# Patient Record
Sex: Female | Born: 1985 | Race: White | Hispanic: No | Marital: Married | Smoking: Current every day smoker
Health system: Southern US, Community
[De-identification: ages and names within clinical notes are randomized; demographics above are authoritative.]

## PROBLEM LIST (undated history)

## (undated) DIAGNOSIS — D179 Benign lipomatous neoplasm, unspecified: Secondary | ICD-10-CM

## (undated) DIAGNOSIS — F419 Anxiety disorder, unspecified: Secondary | ICD-10-CM

## (undated) DIAGNOSIS — R569 Unspecified convulsions: Secondary | ICD-10-CM

## (undated) HISTORY — PX: TONSILLECTOMY: SUR1361

## (undated) HISTORY — DX: Benign lipomatous neoplasm, unspecified: D17.9

---
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
## 2006-02-10 ENCOUNTER — Emergency Department: Payer: Self-pay | Admitting: Unknown Physician Specialty

## 2007-11-08 ENCOUNTER — Ambulatory Visit: Payer: Self-pay | Admitting: Family Medicine

## 2009-11-30 ENCOUNTER — Emergency Department: Payer: Self-pay | Admitting: Internal Medicine

## 2009-12-08 ENCOUNTER — Emergency Department: Payer: Self-pay | Admitting: Emergency Medicine

## 2010-08-28 ENCOUNTER — Emergency Department: Payer: Self-pay | Admitting: Unknown Physician Specialty

## 2011-01-02 ENCOUNTER — Observation Stay: Payer: Self-pay | Admitting: Obstetrics and Gynecology

## 2011-03-02 ENCOUNTER — Inpatient Hospital Stay: Payer: Self-pay

## 2012-05-26 IMAGING — US US PELV - US TRANSVAGINAL
1 series · 17 of 25 positions shown · non-contrast
Comparison: none

REASON FOR EXAM: pain lower pelvic area bilat
COMMENTS:

[Series 1: us pelv - us transvaginal · 17 of 48 slices shown]
[im 1/48]
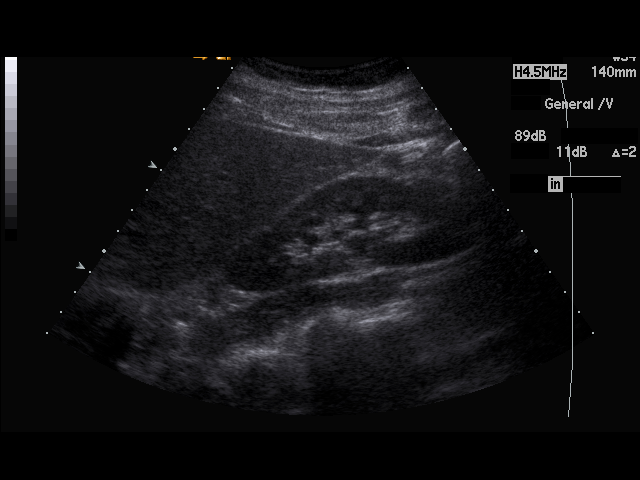
[im 4/48]
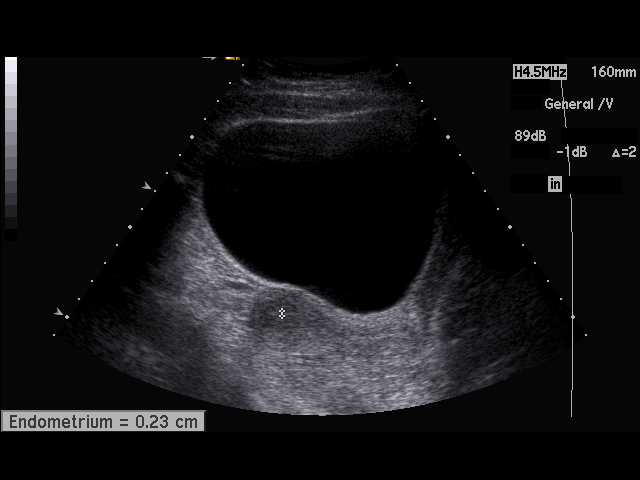
[im 6/48]
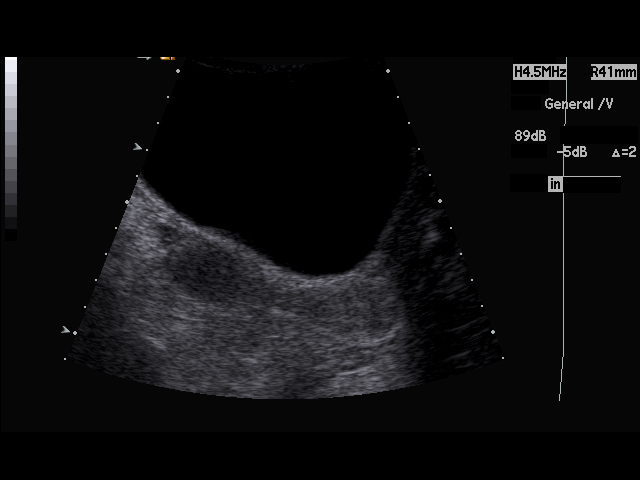
[im 10/48]
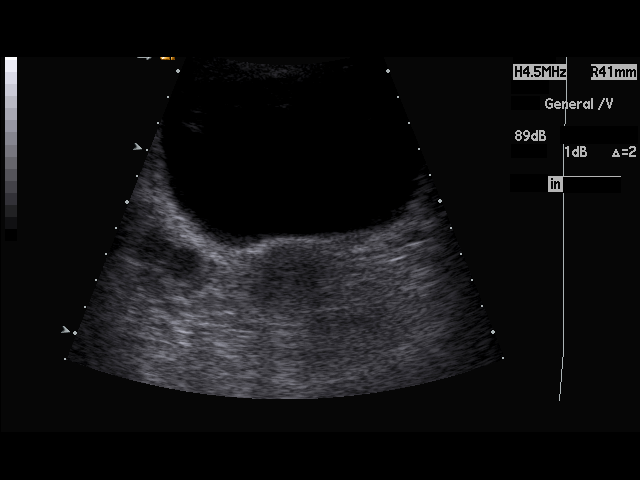
[im 12/48]
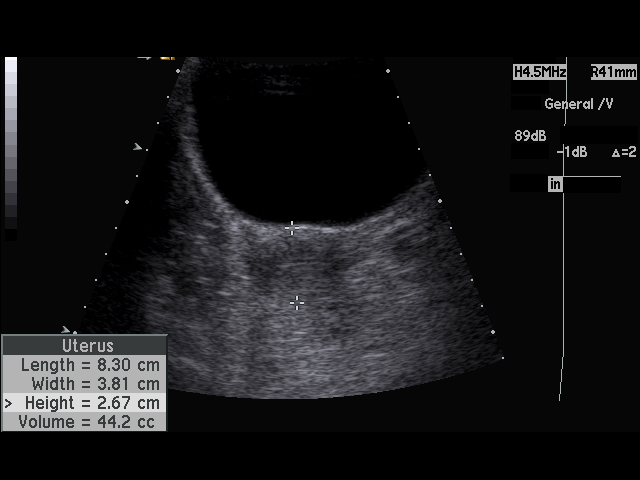
[im 16/48]
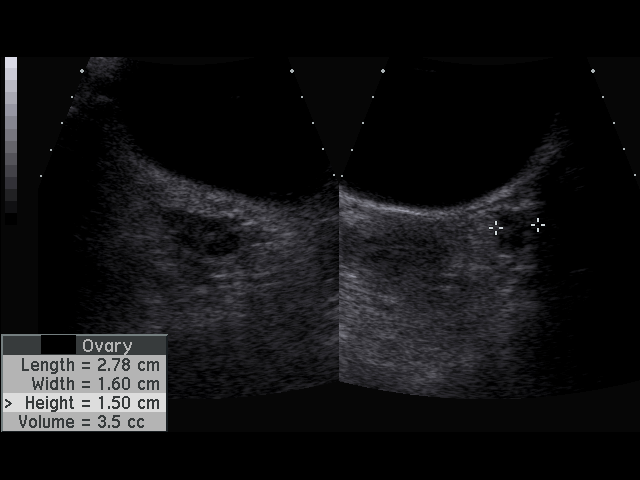
[im 18/48]
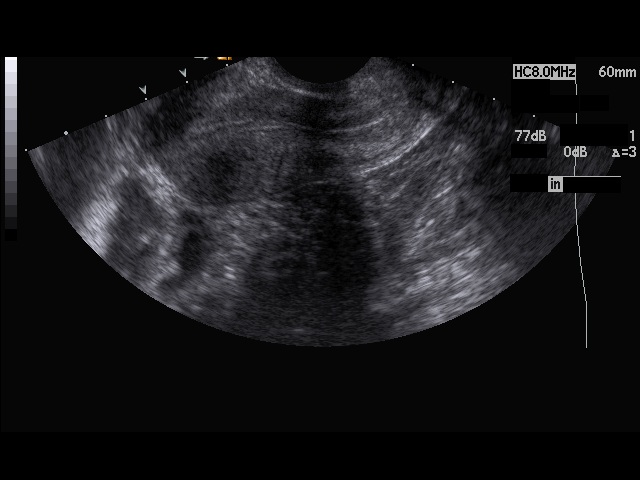
[im 22/48]
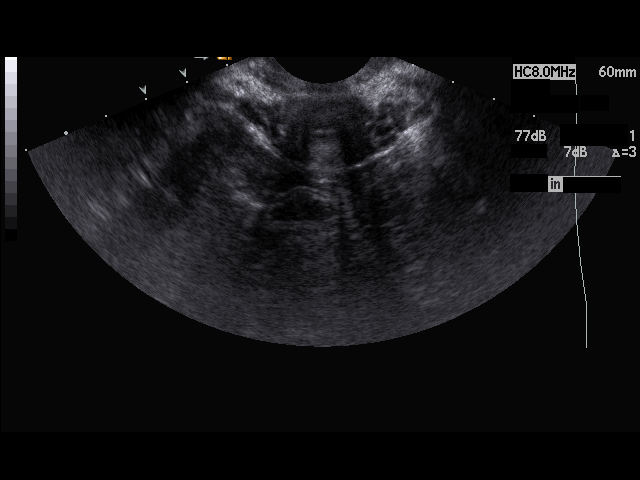
[im 24/48]
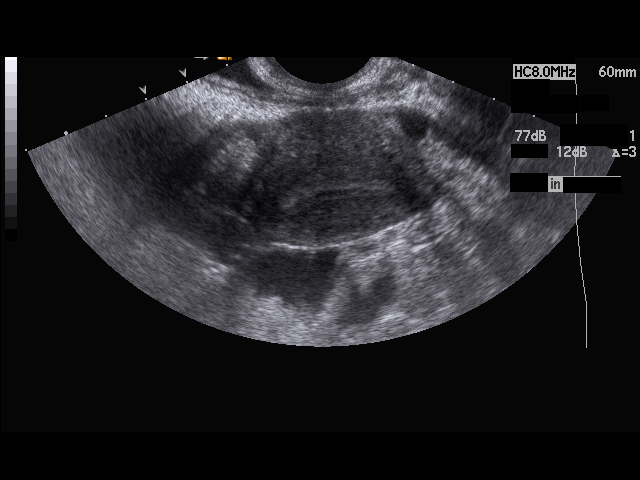
[im 26/48]
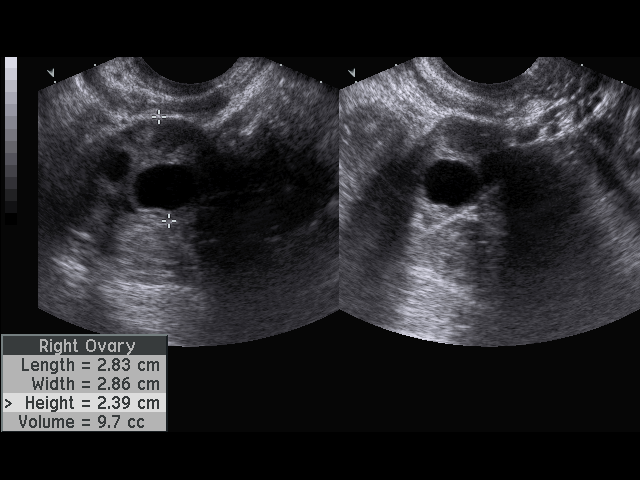
[im 30/48]
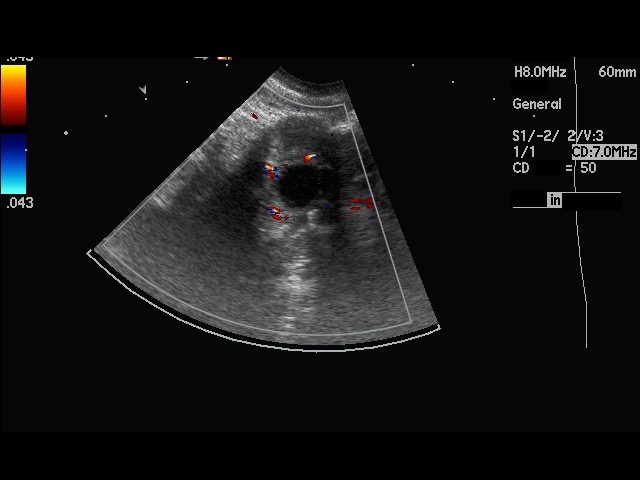
[im 32/48]
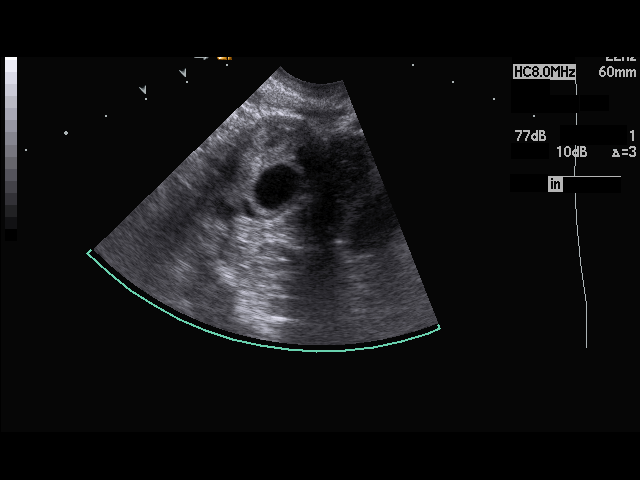
[im 36/48]
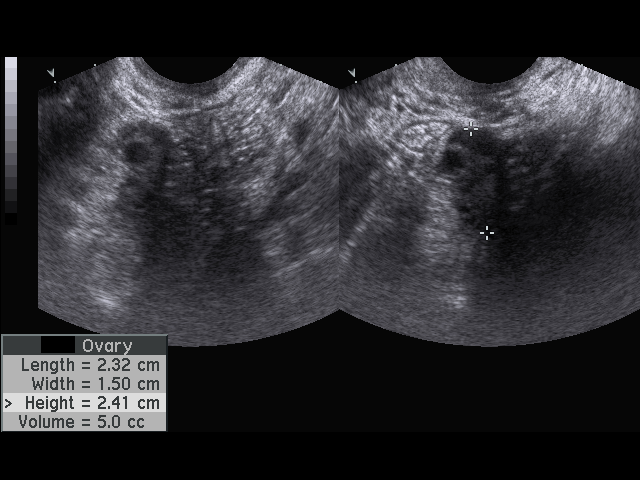
[im 38/48]
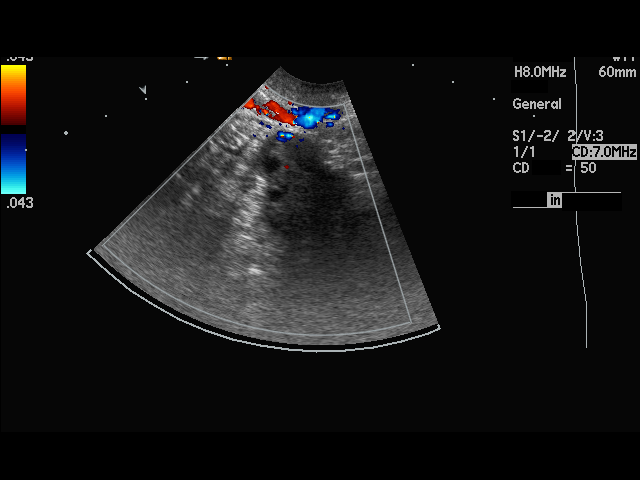
[im 42/48]
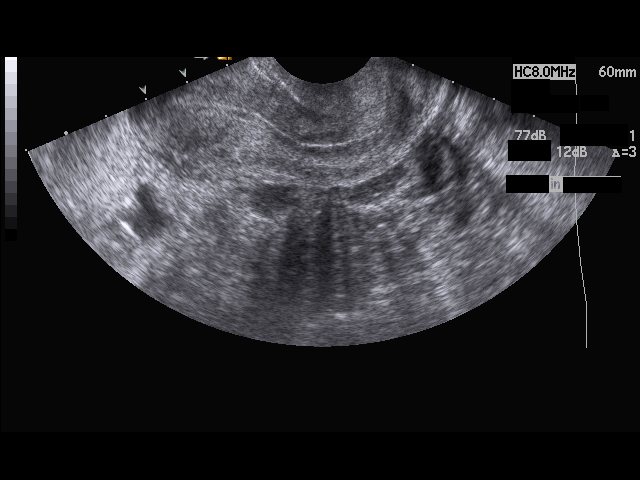
[im 44/48]
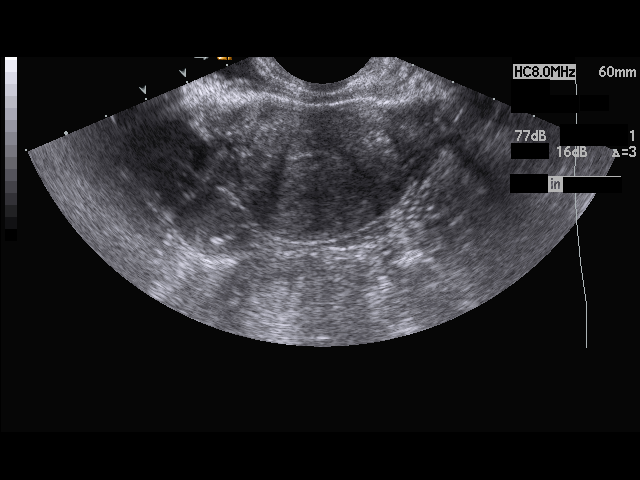
[im 48/48]
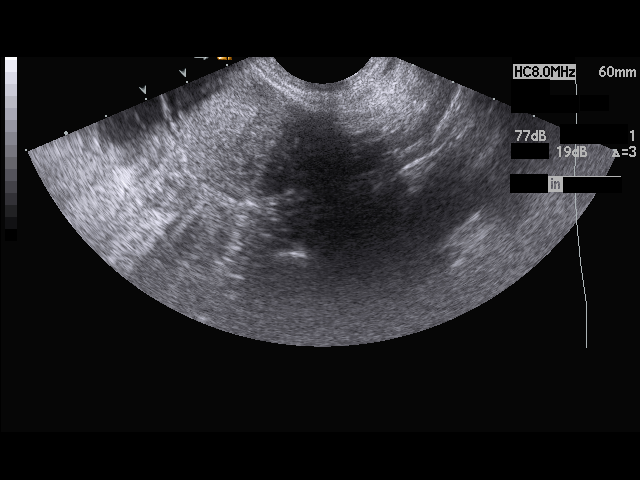

[17 of 25 positions shown; findings below may reference images not displayed]

PROCEDURE:     US  - US PELVIS EXAM W/TRANSVAGINAL  - December 08, 2009  [DATE]

RESULT:     Comparison: None.

Technique and Findings:
Grayscale and color Doppler images were obtained of the pelvis. Endovaginal
ultrasound was also performed.

The uterus measures 8.3 x 3.8 x 2.7 cm. The endometrial stripe measures
mm. No free fluid identified in the pelvis.

The right ovary measures 2.8 x 2.9 x 2.4 cm. The left ovary measures 2.3 x
1.5 x 2.4 cm. There are several anechoic cystic structures in the bilateral
ovaries which likely represents cysts or follicles. These are less than 2 cm
in diameter. Color Doppler flow is associated with the bilateral ovaries.
IMPRESSION: No acute findings. If there is continued clinical concern for ovarian
torsion, further investigation with spectral Doppler ultrasound imaging of
the ovaries is recommended.

## 2013-09-10 ENCOUNTER — Ambulatory Visit: Payer: Self-pay | Admitting: Obstetrics & Gynecology

## 2013-09-10 LAB — CBC WITH DIFFERENTIAL/PLATELET
Basophil #: 0 10*3/uL (ref 0.0–0.1)
Basophil %: 0.1 %
EOS ABS: 0.1 10*3/uL (ref 0.0–0.7)
Eosinophil %: 1 %
HCT: 38.3 % (ref 35.0–47.0)
HGB: 13.1 g/dL (ref 12.0–16.0)
LYMPHS ABS: 1.9 10*3/uL (ref 1.0–3.6)
LYMPHS PCT: 18.7 %
MCH: 32.5 pg (ref 26.0–34.0)
MCHC: 34.2 g/dL (ref 32.0–36.0)
MCV: 95 fL (ref 80–100)
MONO ABS: 0.8 x10 3/mm (ref 0.2–0.9)
Monocyte %: 7.8 %
Neutrophil #: 7.4 10*3/uL — ABNORMAL HIGH (ref 1.4–6.5)
Neutrophil %: 72.4 %
Platelet: 166 10*3/uL (ref 150–440)
RBC: 4.03 10*6/uL (ref 3.80–5.20)
RDW: 13 % (ref 11.5–14.5)
WBC: 10.2 10*3/uL (ref 3.6–11.0)

## 2013-09-11 ENCOUNTER — Inpatient Hospital Stay: Payer: Self-pay | Admitting: Obstetrics and Gynecology

## 2013-09-12 LAB — HEMATOCRIT: HCT: 33 % — ABNORMAL LOW (ref 35.0–47.0)

## 2014-08-21 NOTE — Op Note (Signed)
PATIENT NAME:  Paula Diaz, Paula Diaz MR#:  [id number] DATE OF BIRTH:  [birth year]  DATE OF PROCEDURE:  09/11/2013  PREOPERATIVE DIAGNOSIS:  Term intrauterine pregnancy, prior history of cesarean section.   POSTOPERATIVE DIAGNOSIS:  Term intrauterine pregnancy, prior history of cesarean section.   PROCEDURE PERFORMED:  Low transverse cesarean section, placement of On-Q pain pump.   SURGEON:  Glean Salen, M.D.   ASSISTANT:  Baruch Merl.   ANESTHESIA:  Spinal.   ESTIMATED BLOOD LOSS:  250 mL.   COMPLICATIONS:  None.   FINDINGS:  Normal tubes, ovaries and uterus. Viable female infant weighing 8 pounds 11 ounces with Apgar scores of 9 and 9 at 1 and 5 minutes, respectively.   DISPOSITION:  To the Recovery Room in stable condition.   TECHNIQUE:  The patient is prepped and draped in the usual sterile fashion after adequate anesthesia is obtained in the supine position on the Operating Room table. Scalpel was used to create a low transverse skin incision including excision of part of the thickened keloid scar. The incision is carried down to the level of the rectus fascia which is then dissected bilaterally using Mayo scissors. The rectus muscles are dissected away from the rectus fascia and separated in the midline. The peritoneum is penetrated and the bladder is inferiorly dissected and retracted. A scalpel was used to create a low transverse hysterotomy incision that is then extended by blunt dissection. Amniotomy reveals clear fluid. The infant's head is grasped and delivered with the help of a suction device. The oropharynx is suctioned, no nuchal cord is noted and the rest of the infant is delivered without complication.   Cord blood is obtained and the placenta is manually extracted. The uterus is externalized and cleansed of all membranes and debris using a moist sponge. The hysterotomy incision is closed with a running #1 Vicryl suture in a locking fashion followed by a second layer to imbricate  the first layer. Excellent hemostasis is noted. The uterus is then placed back in the intra-abdominal cavity and the paracolic gutters are irrigated with warm saline. Re-examination of the incision reveals excellent hemostasis. The peritoneum is closed with a Vicryl suture.   On-Q pain pump trocars are placed through the abdomen into the subfascial space and then the Silver soaker catheters are threaded into place without difficulty. A Maxon suture used to close the rectus fascia with careful placement of the suture not to incorporate the catheters. The catheters are flushed with 5 mL each of bupivacaine and stabilized in place using bandages.   The subcutaneous layer is irrigated with saline and hemostasis assured using electrocautery. The skin is closed with a 4-0 Vicryl suture in a subcuticular fashion followed by placement of Dermabond. The patient goes to the Recovery Room in stable condition. All sponge, instrument, and needle counts are correct.   ____________________________ R. Barnett Applebaum, MD rph:jm D: 09/11/2013 11:36:40 ET T: 09/11/2013 11:57:08 ET JOB#: [id number]  cc: Glean Salen, MD, <Dictator> Gae Dry MD ELECTRONICALLY SIGNED 09/11/2013 13:11

## 2014-10-01 ENCOUNTER — Encounter: Payer: Self-pay | Admitting: *Deleted

## 2014-10-01 ENCOUNTER — Other Ambulatory Visit: Payer: Self-pay

## 2014-10-01 DIAGNOSIS — Z882 Allergy status to sulfonamides status: Secondary | ICD-10-CM | POA: Diagnosis not present

## 2014-10-01 DIAGNOSIS — F17210 Nicotine dependence, cigarettes, uncomplicated: Secondary | ICD-10-CM | POA: Diagnosis not present

## 2014-10-01 DIAGNOSIS — D171 Benign lipomatous neoplasm of skin and subcutaneous tissue of trunk: Secondary | ICD-10-CM | POA: Diagnosis present

## 2014-10-01 NOTE — Patient Instructions (Signed)
  Your procedure is scheduled on: 10-06-14 Report to [place]. To find out your arrival time please call [phone] between 1PM - 3PM on 10-05-14  Remember: Instructions that are not followed completely may result in serious medical risk, up to and including death, or upon the discretion of your surgeon and anesthesiologist your surgery may need to be rescheduled.    _X___ 1. Do not eat food or drink liquids after midnight. No gum chewing or hard candies.     _X___ 2. No Alcohol for 24 hours before or after surgery.   ____ 3. Bring all medications with you on the day of surgery if instructed.    ____ 4. Notify your doctor if there is any change in your medical condition     (cold, fever, infections).     Do not wear jewelry, make-up, hairpins, clips or nail polish.  Do not wear lotions, powders, or perfumes. You may wear deodorant.  Do not shave 48 hours prior to surgery. Men may shave face and neck.  Do not bring valuables to the hospital.    [facility] is not responsible for any belongings or valuables.               Contacts, dentures or bridgework may not be worn into surgery.  Leave your suitcase in the car. After surgery it may be brought to your room.  For patients admitted to the hospital, discharge time is determined by your  treatment team.   Patients discharged the day of surgery will not be allowed to drive home.   Please read over the following fact sheets that you were given:     ____ Take these medicines the morning of surgery with A SIP OF WATER:    1.NONE  2.   3.   4.  5.  6.  ____ Fleet Enema (as directed)   ____ Use CHG Soap as directed  ____ Use inhalers on the day of surgery  ____ Stop metformin 2 days prior to surgery    ____ Take 1/2 of usual insulin dose the night before surgery and none on the morning of surgery.   ____ Stop Coumadin/Plavix/aspirin-N/A  ____ Stop Anti-inflammatories-NO NSAIDS OR ASA  PRODUCTS-TYLENOL OK   ____ Stop supplements until after surgery.    ____ Bring C-Pap to the hospital.

## 2014-10-06 ENCOUNTER — Ambulatory Visit: Payer: BC Managed Care – PPO | Admitting: Anesthesiology

## 2014-10-06 ENCOUNTER — Ambulatory Visit
Admission: RE | Admit: 2014-10-06 | Discharge: 2014-10-06 | Disposition: A | Discharge status: Home / Self Care | Payer: BC Managed Care – PPO | Source: Ambulatory Visit | Attending: Surgery | Admitting: Surgery

## 2014-10-06 ENCOUNTER — Encounter
Admission: RE | Disposition: A | Discharge status: Home / Self Care | Payer: Self-pay | Source: Ambulatory Visit | Attending: Surgery

## 2014-10-06 ENCOUNTER — Encounter: Payer: Self-pay | Admitting: *Deleted

## 2014-10-06 DIAGNOSIS — D171 Benign lipomatous neoplasm of skin and subcutaneous tissue of trunk: Secondary | ICD-10-CM | POA: Insufficient documentation

## 2014-10-06 DIAGNOSIS — Z882 Allergy status to sulfonamides status: Secondary | ICD-10-CM | POA: Insufficient documentation

## 2014-10-06 DIAGNOSIS — F17210 Nicotine dependence, cigarettes, uncomplicated: Secondary | ICD-10-CM | POA: Insufficient documentation

## 2014-10-06 HISTORY — DX: Anxiety disorder, unspecified: F41.9

## 2014-10-06 HISTORY — DX: Unspecified convulsions: R56.9

## 2014-10-06 HISTORY — PX: LIPOMA EXCISION: SHX5283

## 2014-10-06 LAB — POCT PREGNANCY, URINE: Preg Test, Ur: NEGATIVE

## 2014-10-06 SURGERY — EXCISION LIPOMA
Anesthesia: General | Laterality: Right

## 2014-10-06 MED ORDER — DEXAMETHASONE SODIUM PHOSPHATE 4 MG/ML IJ SOLN
INTRAMUSCULAR | Status: DC | PRN
  Administered 2014-10-06: 8 mg via INTRAVENOUS

## 2014-10-06 MED ORDER — ONDANSETRON HCL 4 MG/2ML IJ SOLN
INTRAMUSCULAR | Status: DC | PRN
  Administered 2014-10-06: 4 mg via INTRAVENOUS

## 2014-10-06 MED ORDER — HYDROCODONE-ACETAMINOPHEN 5-325 MG PO TABS: 1.0000 | ORAL_TABLET | Freq: Four times a day (QID) | ORAL | Status: DC | PRN

## 2014-10-06 MED ORDER — ONDANSETRON HCL 4 MG/2ML IJ SOLN: 4.0000 mg | Freq: Once | INTRAMUSCULAR | Status: DC | PRN

## 2014-10-06 MED ORDER — HYDROCODONE-ACETAMINOPHEN 5-325 MG PO TABS
ORAL_TABLET | ORAL | Status: AC
  Administered 2014-10-06: 1 via ORAL
  Filled 2014-10-06: qty 1

## 2014-10-06 MED ORDER — NEOSTIGMINE METHYLSULFATE 10 MG/10ML IV SOLN
INTRAVENOUS | Status: DC | PRN
  Administered 2014-10-06: 3 mg via INTRAVENOUS

## 2014-10-06 MED ORDER — HYDROCODONE-ACETAMINOPHEN 5-325 MG PO TABS
1.0000 | ORAL_TABLET | Freq: Four times a day (QID) | ORAL | Status: DC | PRN
  Administered 2014-10-06: 1 via ORAL

## 2014-10-06 MED ORDER — LACTATED RINGERS IV SOLN
INTRAVENOUS | Status: DC
  Administered 2014-10-06 (×2): via INTRAVENOUS

## 2014-10-06 MED ORDER — FENTANYL CITRATE (PF) 100 MCG/2ML IJ SOLN
INTRAMUSCULAR | Status: DC | PRN
  Administered 2014-10-06 (×2): 100 ug via INTRAVENOUS

## 2014-10-06 MED ORDER — FAMOTIDINE 20 MG PO TABS
ORAL_TABLET | ORAL | Status: AC
  Administered 2014-10-06: 20 mg via ORAL
  Filled 2014-10-06: qty 1

## 2014-10-06 MED ORDER — GLYCOPYRROLATE 0.2 MG/ML IJ SOLN
INTRAMUSCULAR | Status: DC | PRN
  Administered 2014-10-06: 0.4 mg via INTRAVENOUS

## 2014-10-06 MED ORDER — FENTANYL CITRATE (PF) 100 MCG/2ML IJ SOLN
25.0000 ug | INTRAMUSCULAR | Status: DC | PRN
  Administered 2014-10-06 (×4): 25 ug via INTRAVENOUS

## 2014-10-06 MED ORDER — FENTANYL CITRATE (PF) 100 MCG/2ML IJ SOLN
INTRAMUSCULAR | Status: AC
  Filled 2014-10-06: qty 2

## 2014-10-06 MED ORDER — PROPOFOL 10 MG/ML IV BOLUS
INTRAVENOUS | Status: DC | PRN
  Administered 2014-10-06: 150 mg via INTRAVENOUS

## 2014-10-06 MED ORDER — MIDAZOLAM HCL 2 MG/2ML IJ SOLN
INTRAMUSCULAR | Status: DC | PRN
  Administered 2014-10-06: 2 mg via INTRAVENOUS

## 2014-10-06 MED ORDER — LIDOCAINE HCL (CARDIAC) 20 MG/ML IV SOLN
INTRAVENOUS | Status: DC | PRN
  Administered 2014-10-06: 60 mg via INTRAVENOUS

## 2014-10-06 MED ORDER — ROCURONIUM BROMIDE 100 MG/10ML IV SOLN
INTRAVENOUS | Status: DC | PRN
  Administered 2014-10-06: 40 mg via INTRAVENOUS

## 2014-10-06 MED ORDER — FAMOTIDINE 20 MG PO TABS
20.0000 mg | ORAL_TABLET | Freq: Once | ORAL | Status: AC
  Administered 2014-10-06: 20 mg via ORAL

## 2014-10-06 SURGICAL SUPPLY — 21 items
BLADE SURG 15 STRL LF DISP TIS (BLADE) ×1 IMPLANT
BLADE SURG 15 STRL SS (BLADE) ×1
CHLORAPREP W/TINT 26ML (MISCELLANEOUS) ×2 IMPLANT
DRAPE LAPAROTOMY 100X77 ABD (DRAPES) ×2 IMPLANT
ELECT CAUTERY BLADE 6.4 (BLADE) ×2 IMPLANT
GAUZE SPONGE 4X4 12PLY STRL (GAUZE/BANDAGES/DRESSINGS) ×2 IMPLANT
GLOVE BIO SURGEON STRL SZ7.5 (GLOVE) ×6 IMPLANT
GOWN STRL REUS W/ TWL LRG LVL3 (GOWN DISPOSABLE) ×3 IMPLANT
GOWN STRL REUS W/TWL LRG LVL3 (GOWN DISPOSABLE) ×3
KIT RM TURNOVER STRD PROC AR (KITS) ×2 IMPLANT
LABEL OR SOLS (LABEL) IMPLANT
NDL SAFETY 25GX1.5 (NEEDLE) ×2 IMPLANT
NS IRRIG 500ML POUR BTL (IV SOLUTION) ×2 IMPLANT
PACK BASIN MINOR ARMC (MISCELLANEOUS) ×2 IMPLANT
PAD GROUND ADULT SPLIT (MISCELLANEOUS) ×2 IMPLANT
SUT STRIP PLUS 1X5 (SUTURE) ×2 IMPLANT
SUT VIC AB 3-0 SH 27 (SUTURE) ×1
SUT VIC AB 3-0 SH 27X BRD (SUTURE) ×1 IMPLANT
SUTURE MNCRYL 4-0 (SUTURE) ×2 IMPLANT
SYR BULB EAR ULCER 3OZ GRN STR (SYRINGE) ×2 IMPLANT
SYRINGE 10CC LL (SYRINGE) ×2 IMPLANT

## 2014-10-06 NOTE — Anesthesia Procedure Notes (Signed)
Procedure Name: Intubation Date/Time: 10/06/2014 7:30 AM Performed by: Letitia Neri Pre-anesthesia Checklist: Patient identified, Emergency Drugs available, Suction available, Patient being monitored and Timeout performed Patient Re-evaluated:Patient Re-evaluated prior to inductionOxygen Delivery Method: Circle system utilized Preoxygenation: Pre-oxygenation with 100% oxygen Intubation Type: IV induction Ventilation: Mask ventilation without difficulty Laryngoscope Size: Mac and 3 Grade View: Grade I Tube type: Oral Tube size: 7.0 mm Placement Confirmation: ETT inserted through vocal cords under direct vision,  positive ETCO2 and breath sounds checked- equal and bilateral Secured at: 21 cm Tube secured with: Tape Dental Injury: Teeth and Oropharynx as per pre-operative assessment

## 2014-10-06 NOTE — Transfer of Care (Signed)
Immediate Anesthesia Transfer of Care Note  Patient: Paula Diaz  Procedure(s) Performed: Procedure(s): EXCISION LIPOMA (Right)  Patient Location: PACU  Anesthesia Type:General  Level of Consciousness: sedated  Airway & Oxygen Therapy: Patient Spontanous Breathing  Post-op Assessment: Report given to RN and Post -op Vital signs reviewed and stable  Post vital signs: Reviewed and stable  Last Vitals:  Filed Vitals:   10/06/14 0619  BP: 116/63  Pulse: 83  Temp: 36.6 C  Resp: 16    Complications: No apparent anesthesia complications

## 2014-10-06 NOTE — Progress Notes (Signed)
H&P unchanged. Paper copy to be scanned into the system. The risks of surgery were again reviewed with the patient and family and they agree to proceed.

## 2014-10-06 NOTE — Op Note (Signed)
Operative Note  Preoperative Diagnosis: Right back subcutaneous lipoma  Postoperative Diagnosis: Same  Operation Performed: Excision of right back subcutaneous lipoma  Surgeon: Laverle Patter., M.D.   Assistant: None  Anesthesia: General Endotracheal  Date of Procedure: 10/06/2014   Procedure in Detail:  The risks (including the possibility of adjacent organ injury, the necessity of converting to an open procedure, and the risk of postoperative infection / abscess), potential benefits, non-surgical treatment options, and expected outcomes were reviewed with the patient. The patient concurred with the proposed plan and agreed to proceed, giving informed consent.   Prior to the induction of anesthesia, antibiotic prophylaxis was administered. The patient was placed supine on the OR table and prepped and draped in the usual sterile fashion.   A transverse incision was made overlying the lipoma in the lines of the skin and carried down through the skin and subcutaneous tissue to the lipomatous capsule which was excised from surrounding subcutaneous tissue and the muscular fascia. The entire capsule and lipoma were successfully removed. Hemostasis was achieved with electrocautery a subdermal closure was performed with interrupted 3-0 Vicryl and the skin was reapproximated with a running subcuticular 4-0 Monocryl and suture strip.  Findings: 30 x 75 x 70 mm encapsulated lipoma          Specimens: Lipoma           Complications: None; the patient tolerated the procedure well.   Consuela Mimes, MD 10/06/2014

## 2014-10-06 NOTE — Anesthesia Postprocedure Evaluation (Signed)
  Anesthesia Post-op Note  Patient: Paula Diaz  Procedure(s) Performed: Procedure(s): EXCISION LIPOMA (Right)  Anesthesia type:General  Patient location: PACU  Post pain: Pain level controlled  Post assessment: Post-op Vital signs reviewed, Patient's Cardiovascular Status Stable, Respiratory Function Stable, Patent Airway and No signs of Nausea or vomiting  Post vital signs: Reviewed and stable  Last Vitals:  Filed Vitals:   10/06/14 0847  BP: 118/66  Pulse: 122  Temp: 37.2 C  Resp: 20    Level of consciousness: awake, alert  and patient cooperative  Complications: No apparent anesthesia complications

## 2014-10-06 NOTE — Anesthesia Preprocedure Evaluation (Signed)
Anesthesia Evaluation  Patient identified by MRN, date of birth, ID band Patient awake    Reviewed: Allergy & Precautions, NPO status   History of Anesthesia Complications Negative for: history of anesthetic complications  Airway Mallampati: I       Dental no notable dental hx.    Pulmonary asthma , Current Smoker,    Pulmonary exam normal       Cardiovascular negative cardio ROS Normal cardiovascular exam    Neuro/Psych Seizures -,  Anxiety    GI/Hepatic negative GI ROS, Neg liver ROS,   Endo/Other  negative endocrine ROS  Renal/GU negative Renal ROS  negative genitourinary   Musculoskeletal negative musculoskeletal ROS (+)   Abdominal Normal abdominal exam  (+)   Peds negative pediatric ROS (+)  Hematology negative hematology ROS (+)   Anesthesia Other Findings   Reproductive/Obstetrics negative OB ROS                             Anesthesia Physical Anesthesia Plan  ASA: II  Anesthesia Plan: General   Post-op Pain Management:    Induction: Intravenous  Airway Management Planned: Oral ETT  Additional Equipment:   Intra-op Plan:   Post-operative Plan: Extubation in OR  Informed Consent: I have reviewed the patients History and Physical, chart, labs and discussed the procedure including the risks, benefits and alternatives for the proposed anesthesia with the patient or authorized representative who has indicated his/her understanding and acceptance.     Plan Discussed with: CRNA  Anesthesia Plan Comments:         Anesthesia Quick Evaluation

## 2014-10-07 LAB — SURGICAL PATHOLOGY

## 2014-10-08 ENCOUNTER — Encounter: Payer: Self-pay | Admitting: Surgery

## 2014-10-08 ENCOUNTER — Ambulatory Visit: Payer: Self-pay | Admitting: Surgery

## 2014-10-08 ENCOUNTER — Ambulatory Visit (INDEPENDENT_AMBULATORY_CARE_PROVIDER_SITE_OTHER): Payer: BC Managed Care – PPO | Admitting: Surgery

## 2014-10-08 VITALS — BP 132/77 | Temp 98.7°F | Resp 91 | Ht 66.0 in | Wt 170.0 lb

## 2014-10-08 DIAGNOSIS — L723 Sebaceous cyst: Secondary | ICD-10-CM

## 2014-10-08 NOTE — Patient Instructions (Signed)
Call us if you need us

## 2014-10-11 NOTE — Progress Notes (Signed)
Doing well. No Pain. Path Benign. Incision Clean.

## 2014-10-19 ENCOUNTER — Ambulatory Visit: Payer: Self-pay | Admitting: Surgery

## 2019-08-23 ENCOUNTER — Ambulatory Visit: Payer: BC Managed Care – PPO | Attending: Internal Medicine

## 2019-08-23 DIAGNOSIS — Z23 Encounter for immunization: Secondary | ICD-10-CM

## 2019-08-23 NOTE — Progress Notes (Signed)
   Covid-19 Vaccination Clinic  Name:  Paula Diaz    MRN: [id number] DOB: 1985-10-11  08/23/2019  [name] was observed post Covid-19 immunization for 15 minutes without incident. She was provided with Vaccine Information Sheet and instruction to access the V-Safe system.   [name] was instructed to call 911 with any severe reactions post vaccine: Marland Kitchen Difficulty breathing  . Swelling of face and throat  . A fast heartbeat  . A bad rash all over body  . Dizziness and weakness   Immunizations Administered    Name Date Dose VIS Date Route   Pfizer COVID-19 Vaccine 08/23/2019  9:21 AM 0.3 mL 06/24/2018 Intramuscular   Manufacturer: [employer], [employer]   Lot: [id number]   [place]: [id number]

## 2019-09-14 ENCOUNTER — Ambulatory Visit: Payer: BC Managed Care – PPO | Attending: Internal Medicine

## 2019-09-14 ENCOUNTER — Other Ambulatory Visit: Payer: Self-pay

## 2019-09-14 DIAGNOSIS — Z23 Encounter for immunization: Secondary | ICD-10-CM

## 2019-09-14 NOTE — Progress Notes (Signed)
   Covid-19 Vaccination Clinic  Name:  Paula Diaz    MRN: [id number] DOB: 11/27/1985  09/14/2019  [name] was observed post Covid-19 immunization for 15 minutes without incident. She was provided with Vaccine Information Sheet and instruction to access the V-Safe system.   Ms. Matters was instructed to call 911 with any severe reactions post vaccine: Marland Kitchen Difficulty breathing  . Swelling of face and throat  . A fast heartbeat  . A bad rash all over body  . Dizziness and weakness   Immunizations Administered    Name Date Dose VIS Date Route   Pfizer COVID-19 Vaccine 09/14/2019  1:50 PM 0.3 mL 06/24/2018 Intramuscular   Manufacturer: [place]   Lot: [id number]   [place]: [id number]
